# Patient Record
Sex: Female | Born: 1970 | Race: White | Hispanic: No | Marital: Married | Smoking: Never smoker
Health system: Southern US, Community
[De-identification: ages and names within clinical notes are randomized; demographics above are authoritative.]

## PROBLEM LIST (undated history)

## (undated) DIAGNOSIS — E039 Hypothyroidism, unspecified: Secondary | ICD-10-CM

## (undated) DIAGNOSIS — F419 Anxiety disorder, unspecified: Secondary | ICD-10-CM

## (undated) DIAGNOSIS — T4145XA Adverse effect of unspecified anesthetic, initial encounter: Secondary | ICD-10-CM

## (undated) HISTORY — PX: TONSILLECTOMY: SUR1361

---

## 1999-04-01 ENCOUNTER — Other Ambulatory Visit: Admission: RE | Admit: 1999-04-01 | Discharge: 1999-04-01 | Payer: Self-pay | Admitting: *Deleted

## 1999-04-27 ENCOUNTER — Other Ambulatory Visit: Admission: RE | Admit: 1999-04-27 | Discharge: 1999-04-27 | Payer: Self-pay | Admitting: Obstetrics & Gynecology

## 1999-04-27 ENCOUNTER — Encounter (INDEPENDENT_AMBULATORY_CARE_PROVIDER_SITE_OTHER): Payer: Self-pay

## 1999-06-14 ENCOUNTER — Other Ambulatory Visit: Admission: RE | Admit: 1999-06-14 | Discharge: 1999-06-14 | Payer: Self-pay | Admitting: Obstetrics & Gynecology

## 1999-09-02 ENCOUNTER — Other Ambulatory Visit: Admission: RE | Admit: 1999-09-02 | Discharge: 1999-09-02 | Payer: Self-pay | Admitting: Obstetrics & Gynecology

## 1999-12-15 ENCOUNTER — Other Ambulatory Visit: Admission: RE | Admit: 1999-12-15 | Discharge: 1999-12-15 | Payer: Self-pay | Admitting: *Deleted

## 2000-04-10 ENCOUNTER — Other Ambulatory Visit: Admission: RE | Admit: 2000-04-10 | Discharge: 2000-04-10 | Payer: Self-pay | Admitting: *Deleted

## 2000-10-19 ENCOUNTER — Other Ambulatory Visit: Admission: RE | Admit: 2000-10-19 | Discharge: 2000-10-19 | Payer: Self-pay | Admitting: Obstetrics & Gynecology

## 2001-04-15 ENCOUNTER — Other Ambulatory Visit: Admission: RE | Admit: 2001-04-15 | Discharge: 2001-04-15 | Payer: Self-pay | Admitting: Obstetrics and Gynecology

## 2002-05-01 ENCOUNTER — Other Ambulatory Visit: Admission: RE | Admit: 2002-05-01 | Discharge: 2002-05-01 | Payer: Self-pay | Admitting: Obstetrics and Gynecology

## 2003-06-02 ENCOUNTER — Other Ambulatory Visit: Admission: RE | Admit: 2003-06-02 | Discharge: 2003-06-02 | Payer: Self-pay | Admitting: Obstetrics and Gynecology

## 2008-01-01 ENCOUNTER — Ambulatory Visit (HOSPITAL_COMMUNITY): Admission: RE | Admit: 2008-01-01 | Discharge: 2008-01-01 | Payer: Self-pay | Admitting: Obstetrics & Gynecology

## 2008-02-01 ENCOUNTER — Ambulatory Visit (HOSPITAL_COMMUNITY): Admission: RE | Admit: 2008-02-01 | Discharge: 2008-02-01 | Payer: Self-pay | Admitting: Obstetrics & Gynecology

## 2008-02-02 ENCOUNTER — Inpatient Hospital Stay (HOSPITAL_COMMUNITY): Admission: AD | Admit: 2008-02-02 | Discharge: 2008-02-02 | Payer: Self-pay | Admitting: *Deleted

## 2008-09-04 DIAGNOSIS — T8859XA Other complications of anesthesia, initial encounter: Secondary | ICD-10-CM

## 2008-09-04 HISTORY — DX: Other complications of anesthesia, initial encounter: T88.59XA

## 2009-02-08 ENCOUNTER — Inpatient Hospital Stay (HOSPITAL_COMMUNITY): Admission: RE | Admit: 2009-02-08 | Discharge: 2009-02-11 | Payer: Self-pay | Admitting: Obstetrics and Gynecology

## 2009-03-24 ENCOUNTER — Ambulatory Visit: Admission: RE | Admit: 2009-03-24 | Discharge: 2009-03-24 | Payer: Self-pay | Admitting: Obstetrics and Gynecology

## 2009-05-28 ENCOUNTER — Ambulatory Visit: Payer: Self-pay | Admitting: Family Medicine

## 2009-05-28 DIAGNOSIS — E039 Hypothyroidism, unspecified: Secondary | ICD-10-CM | POA: Insufficient documentation

## 2009-05-28 DIAGNOSIS — E559 Vitamin D deficiency, unspecified: Secondary | ICD-10-CM | POA: Insufficient documentation

## 2009-05-28 DIAGNOSIS — F411 Generalized anxiety disorder: Secondary | ICD-10-CM | POA: Insufficient documentation

## 2009-05-31 ENCOUNTER — Telehealth (INDEPENDENT_AMBULATORY_CARE_PROVIDER_SITE_OTHER): Payer: Self-pay | Admitting: *Deleted

## 2009-06-01 LAB — CONVERTED CEMR LAB
ALT: 32 units/L (ref 0–35)
AST: 26 units/L (ref 0–37)
Albumin: 4.6 g/dL (ref 3.5–5.2)
BUN: 13 mg/dL (ref 6–23)
Basophils Relative: 1.6 % (ref 0.0–3.0)
Chloride: 107 meq/L (ref 96–112)
Cholesterol: 169 mg/dL (ref 0–200)
Eosinophils Relative: 2.6 % (ref 0.0–5.0)
Free T4: 1.2 ng/dL (ref 0.6–1.6)
HCT: 41 % (ref 36.0–46.0)
Hemoglobin: 13.7 g/dL (ref 12.0–15.0)
LDL Cholesterol: 82 mg/dL (ref 0–99)
Lymphs Abs: 2.3 10*3/uL (ref 0.7–4.0)
MCV: 89.5 fL (ref 78.0–100.0)
Monocytes Absolute: 0.4 10*3/uL (ref 0.1–1.0)
Monocytes Relative: 7.4 % (ref 3.0–12.0)
Neutro Abs: 2.4 10*3/uL (ref 1.4–7.7)
Platelets: 239 10*3/uL (ref 150.0–400.0)
Potassium: 4.1 meq/L (ref 3.5–5.1)
Sodium: 141 meq/L (ref 135–145)
TSH: 0.07 microintl units/mL — ABNORMAL LOW (ref 0.35–5.50)
Total Bilirubin: 0.7 mg/dL (ref 0.3–1.2)
Total Protein: 7.7 g/dL (ref 6.0–8.3)
Vit D, 25-Hydroxy: 31 ng/mL (ref 30–89)
WBC: 5.3 10*3/uL (ref 4.5–10.5)

## 2009-06-16 ENCOUNTER — Encounter (INDEPENDENT_AMBULATORY_CARE_PROVIDER_SITE_OTHER): Payer: Self-pay | Admitting: *Deleted

## 2009-06-21 ENCOUNTER — Ambulatory Visit: Payer: Self-pay | Admitting: Family Medicine

## 2009-06-21 DIAGNOSIS — M62838 Other muscle spasm: Secondary | ICD-10-CM | POA: Insufficient documentation

## 2009-06-21 DIAGNOSIS — L659 Nonscarring hair loss, unspecified: Secondary | ICD-10-CM | POA: Insufficient documentation

## 2009-06-21 DIAGNOSIS — E079 Disorder of thyroid, unspecified: Secondary | ICD-10-CM | POA: Insufficient documentation

## 2009-06-23 ENCOUNTER — Encounter (INDEPENDENT_AMBULATORY_CARE_PROVIDER_SITE_OTHER): Payer: Self-pay | Admitting: *Deleted

## 2010-01-02 IMAGING — RF DG HYSTEROGRAM
4 series · 4 of 4 positions shown · IV contrast (omnipaque)
Comparison: none

CLINICAL DATA: HYSTEROSALPINGOGRAM
TECHNIQUE: Following cleansing of the cervix and vagina with
Betadine solution, a hysterosalpingogram was performed using a 5-
French hysterosalpingogram catheter and Omnipaque 300 contrast.
The patient tolerated the exam without difficulty.

[Series 1: run · 1 of 1 slices shown (1 of 4)]
[im 1/1]
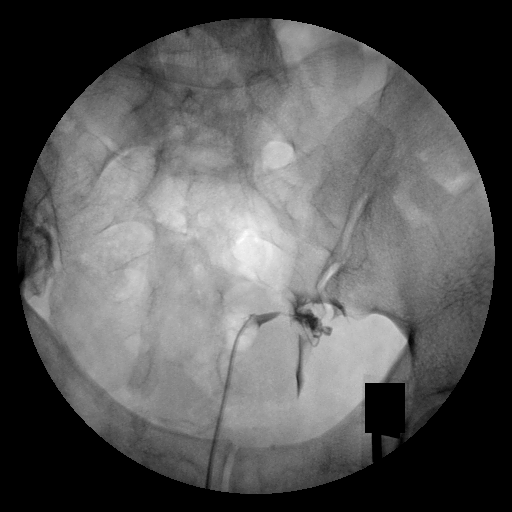

[Series 2: run · 1 of 1 slices shown (2 of 4)]
[im 1/1]
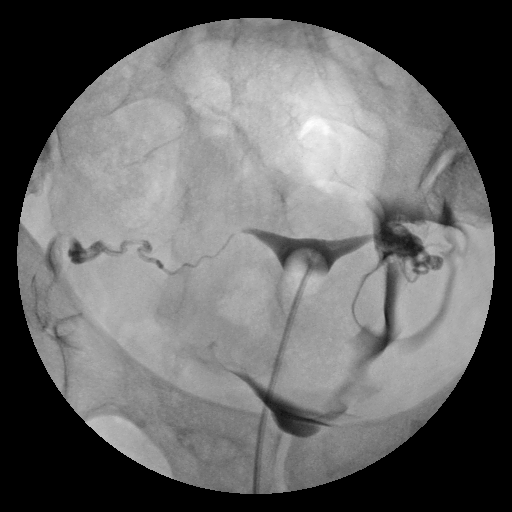

[Series 3: run · 1 of 1 slices shown (3 of 4)]
[im 1/1]
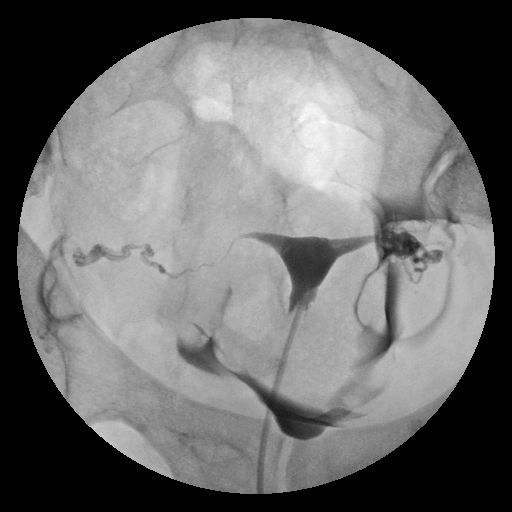

[Series 4: run · 1 of 1 slices shown (4 of 4)]
[im 1/1]
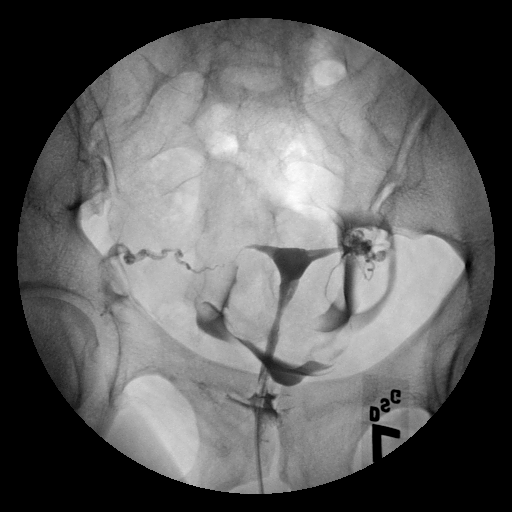

[4 of 4 positions shown; findings below may reference images not displayed]

FINDINGS: The endometrial cavity of the uterus is normal in contour
and appearance.

Contrast filling of both fallopian tubes is seen, and both tubes
are normal in appearance.  Intraperitoneal spill of contrast from
both fallopian tubes is demonstrated.
IMPRESSION: Normal study.  Fallopian tubes are patent bilaterally.

## 2010-12-12 LAB — CBC
HCT: 27.9 % — ABNORMAL LOW (ref 36.0–46.0)
HCT: 36.8 % (ref 36.0–46.0)
Hemoglobin: 13.2 g/dL (ref 12.0–15.0)
MCHC: 34.9 g/dL (ref 30.0–36.0)
MCHC: 35.7 g/dL (ref 30.0–36.0)
MCV: 91 fL (ref 78.0–100.0)
MCV: 93.3 fL (ref 78.0–100.0)
Platelets: 107 10*3/uL — ABNORMAL LOW (ref 150–400)
RBC: 4.05 MIL/uL (ref 3.87–5.11)
WBC: 10 10*3/uL (ref 4.0–10.5)
WBC: 7.5 10*3/uL (ref 4.0–10.5)

## 2011-01-17 NOTE — Op Note (Signed)
[name], [name]         ACCOUNT NO.:  [id number]   MEDICAL RECORD NO.:  [id number]          PATIENT TYPE:  INP   LOCATION:  9102                          FACILITY:  [facility]   PHYSICIAN:  Zenaida Niece, M.D.DATE OF BIRTH:  1971-01-23   DATE OF PROCEDURE:  02/08/2009  DATE OF DISCHARGE:                               OPERATIVE REPORT   PREOPERATIVE DIAGNOSIS:  Intrauterine pregnancy at 39 weeks breech  presentation.   POSTOPERATIVE DIAGNOSIS:  Intrauterine pregnancy at 39 weeks breech  presentation.   PROCEDURE:  Primary low transverse cesarean section without extensions.   SURGEON:  Zenaida Niece, MD   ASSISTANT:  Huel Cote, MD   ANESTHESIA:  Spinal.   FINDINGS:  The patient had normal gravid anatomy and delivered a viable  female infant with Apgars of 9 and 9, weight 8 pounds 6 ounces.  The baby  was in a compound breech presentation and had a nuchal cord x2.   SPECIMENS:  Placenta sent for cord blood donation and then to Labor and  Delivery.   ESTIMATED BLOOD LOSS:  800 mL.   COMPLICATIONS:  None.   PROCEDURE IN DETAIL:  The patient was taken to the operating room and  placed in the sitting position.  Dr. Pamalee Leyden instilled spinal anesthesia.  She was then placed in the dorsal supine position with a left lateral  tilt.  Abdomen was then prepped and draped in the usual sterile fashion  and a Foley catheter was inserted.  The level of her anesthesia was  found to be adequate and abdomen was entered via a standard Pfannenstiel  incision without complications.  The Alexis disposable self-retaining  retractor was placed and the lower uterine segment was well exposed.  A  4-cm transverse incision was then made in the lower uterine segment  pushing the bladder inferior.  Clear amniotic fluid was noted upon  entering the amniotic cavity.  The incision was then extended digitally.  There was a foot and leg at the incision.  The breech was able to be  brought to  the incision and delivered.  The right leg was then delivered  followed by the left which was a little bit more difficult.  However,  the baby then delivered to the shoulders, the arms delivered without  difficulty, and the head then also delivered without difficulty.  Nuchal  cord x2 was reduced.  Cord was doubly clamped and cut and the infant  handed to the awaiting pediatric team.  Placenta was then removed  manually as the uterus was contracting down quickly.  Placenta was sent  for cord blood donation.  Uterus was wiped dry with a clean lap pad and  all clots and debris removed.  Uterine incision was inspected and found  to be free of extensions.  Uterine incision was closed in 2 layers with  the first layer being a running locking layer with #1 chromic and the  second layer being an imbricating layer also with #1 chromic.  Bleeding  from the right middle of the incision was controlled with two figure-of-  eight sutures of #1 chromic.  This achieved adequate  hemostasis.  Tubes  and ovaries were inspected and found to be normal.  Bleeding from  serosal edges was controlled with electrocautery.  Uterine incision was  again inspected and found to be hemostatic.  The Alexis retractor was  removed.  The subfascial space was then irrigated and made hemostatic  with electrocautery.  Fascia was closed in running fashion starting at  both ends and meeting in the middle with 0 Vicryl.  Subcutaneous tissue  was irrigated and made hemostatic with electrocautery.  Subcutaneous  tissue was closed with running 2-0 plain gut suture.  Skin was then  closed with a running subcuticular suture of 4-0 Prolene as well as  Steri-Strips.  A sterile dressing was then applied.  The patient  tolerated the procedure well and was taken to the recovery room in  stable condition.  Counts were correct x2, and she received Ancef 1 g IV  at the beginning of the procedure and had PAS hose on throughout the   procedure.      Zenaida Niece, M.D.  Electronically Signed     TDM/MEDQ  D:  02/08/2009  T:  02/09/2009  Job:  [id number]

## 2011-01-17 NOTE — H&P (Signed)
[name], [name]         ACCOUNT NO.:  [id number]   MEDICAL RECORD NO.:  [id number]          PATIENT TYPE:  INP   LOCATION:  NA                            [facility]ACILITY:  [facility]   PHYSICIAN:  Zenaida Niece, M.D.DATE OF BIRTH:  1971/03/06   DATE OF ADMISSION:  DATE OF DISCHARGE:                              HISTORY & PHYSICAL   CHIEF COMPLAINT:  Cesarean section for breech.   PRESENT ILLNESS:  This is a 40 year old female, gravida 2, para 0-0-1-0,  with an EGA of 39+ weeks by an LMP consistent with a 7-week ultrasound  with a due date of June 12 who presents for primary cesarean section due  to breech presentation.  Options including external cephalic version  were discussed with the patient and she declines this and thus is  admitted for cesarean section.  Prenatal care has been essentially  uncomplicated.   PRENATAL LABS:  RPR is nonreactive, hepatitis B surface antigen  negative, rubella immune, HIV negative, blood type is O positive with  negative antibody screen.  Gonorrhea and chlamydia negative.  First  trimester screen normal.  MSAFP is normal.  One-hour Glucola 107.  Group  B strep is negative.   PAST OB HISTORY:  One spontaneous abortion.   GYN HISTORY:  The patient conceived with use of intrauterine  insemination and gonadotropin.  She also has a history of a LEEP in 2001  with normal follow-up Pap smears.   PAST MEDICAL HISTORY:  1. Hypothyroidism, well-controlled during pregnancy.  2. She has a history of squamous cell carcinoma of the skin   PAST SURGICAL HISTORY:  Tonsillectomy and adenoidectomy.   ALLERGIES:  AUGMENTIN causes an upset stomach.   MEDICATIONS:  Synthroid 50 mcg daily.   SOCIAL HISTORY:  She is married and denies alcohol, tobacco or drug use.   FAMILY HISTORY:  Noncontributory.   PHYSICAL EXAMINATION:  GENERAL:  This is a well-developed gravid female  in no acute distress.  Weight is 194 pounds, blood pressure was 118/82.  NECK:   Supple without lymphadenopathy or thyromegaly.  LUNGS: Clear to auscultation.  HEART: Regular rate and rhythm without murmur.  ABDOMEN:  Gravid, nontender with fundal height of 39-1/2 cm and breech  presentation by Leopold's.  EXTREMITIES: Have 1+ edema and are nontender.  Cervix is 3, 50, minus 3, and breech presentation.   ASSESSMENT:  1. Intrauterine pregnancy at 39 weeks with persistent breech      presentation.  Patient declines external cephalic version and is      being admitted for primary cesarean section.  All risks of cesarean      section have been discussed and she understands.  2. Hypothyroidism.   PLAN:  Admit the patient on February 08, 2009, for a primary cesarean section  for breech presentation.      Zenaida Niece, M.D.  Electronically Signed     TDM/MEDQ  D:  02/08/2009  T:  02/08/2009  Job:  [id number]

## 2011-01-17 NOTE — Discharge Summary (Signed)
[name], [name]         ACCOUNT NO.:  [id number]   MEDICAL RECORD NO.:  [id number]          PATIENT TYPE:  INP   LOCATION:  9102                          [facility]   PHYSICIAN:  Zenaida Niece, M.D.DATE OF BIRTH:  19-Aug-1971   DATE OF ADMISSION:  02/08/2009  DATE OF DISCHARGE:  02/11/2009                               DISCHARGE SUMMARY   CHIEF COMPLAINT:  Intrauterine pregnancy at 39 weeks with a breech  presentation.   PROCEDURES:  On June 7, she underwent primary low-transverse cesarean  section.   HISTORY AND PHYSICAL:  Briefly, this is a 40 year old female, gravida 2,  para 0-0-1-0 with an EGA of 39+ weeks, who presents for primary cesarean  section due to persistent breech presentation.  Her full history and  physical is dictated.  She conceived with use of intrauterine  insemination and gonadotrophins.  She has a history of a LEEP in 2001  and hypothyroidism.  She has been on Synthroid throughout the pregnancy.  Physical exam significant for gravid abdomen with a fundal height of 39-  1/2 cm.  Cervix is 3, 50, -3 in breech presentation.   HOSPITAL COURSE:  The patient is admitted on the day of surgery and  underwent primary cesarean section under spinal anesthesia.  She had  normal anatomy and delivered a viable female infant with Apgars of 9 and  9, weighed 8 pounds 6 ounces.  There was a compound breech presentation  with nuchal cord x2.  Estimated blood loss was 800 mL.  Postoperatively,  she had no significant complications.  Preoperative hemoglobin was 13.2,  postoperative was 9.8.  Preoperative platelets were 144,000,  postoperative 107,000.  She was rapidly able to ambulate and tolerate a  diet.  On postoperative #3, she was felt to be stable for discharge  home.  At that time, her subcuticular Prolene suture was removed and  Steri-Strips were left in place.   DISCHARGE INSTRUCTIONS:  Regular diet, pelvic rest, no strenuous  activity.  Followup is  in 2 weeks for an incision check.   MEDICATIONS:  Percocet #30 one to two p.o. q. 4-6 hours p.r.n. pain and  over-the-counter ibuprofen as needed, and she is given our discharge  pamphlet.      Zenaida Niece, M.D.  Electronically Signed     TDM/MEDQ  D:  03/19/2009  T:  03/20/2009  Job:  [id number]

## 2011-02-19 ENCOUNTER — Inpatient Hospital Stay (HOSPITAL_COMMUNITY)
Admission: AD | Admit: 2011-02-19 | Discharge: 2011-02-19 | Disposition: A | Discharge status: Home / Self Care | Payer: BC Managed Care – PPO | Source: Ambulatory Visit | Attending: Obstetrics and Gynecology | Admitting: Obstetrics and Gynecology

## 2011-02-19 DIAGNOSIS — O209 Hemorrhage in early pregnancy, unspecified: Secondary | ICD-10-CM | POA: Insufficient documentation

## 2011-03-06 LAB — HIV ANTIBODY (ROUTINE TESTING W REFLEX): HIV: NONREACTIVE

## 2011-03-06 LAB — ABO/RH: RH Type: POSITIVE

## 2011-03-06 LAB — RPR: RPR: NONREACTIVE

## 2011-03-15 ENCOUNTER — Other Ambulatory Visit (HOSPITAL_COMMUNITY): Payer: Self-pay | Admitting: Obstetrics and Gynecology

## 2011-03-15 DIAGNOSIS — O280 Abnormal hematological finding on antenatal screening of mother: Secondary | ICD-10-CM

## 2011-03-15 DIAGNOSIS — O09529 Supervision of elderly multigravida, unspecified trimester: Secondary | ICD-10-CM

## 2011-03-17 ENCOUNTER — Ambulatory Visit (HOSPITAL_COMMUNITY)
Admission: RE | Admit: 2011-03-17 | Discharge: 2011-03-17 | Disposition: A | Discharge status: Home / Self Care | Payer: BC Managed Care – PPO | Source: Ambulatory Visit | Attending: Obstetrics and Gynecology | Admitting: Obstetrics and Gynecology

## 2011-03-17 ENCOUNTER — Encounter (HOSPITAL_COMMUNITY): Payer: Self-pay

## 2011-03-17 DIAGNOSIS — E079 Disorder of thyroid, unspecified: Secondary | ICD-10-CM | POA: Insufficient documentation

## 2011-03-17 DIAGNOSIS — E039 Hypothyroidism, unspecified: Secondary | ICD-10-CM | POA: Insufficient documentation

## 2011-03-17 DIAGNOSIS — O280 Abnormal hematological finding on antenatal screening of mother: Secondary | ICD-10-CM

## 2011-03-17 DIAGNOSIS — O09529 Supervision of elderly multigravida, unspecified trimester: Secondary | ICD-10-CM

## 2011-03-17 DIAGNOSIS — O99280 Endocrine, nutritional and metabolic diseases complicating pregnancy, unspecified trimester: Secondary | ICD-10-CM | POA: Insufficient documentation

## 2011-03-17 NOTE — Progress Notes (Signed)
Report in AS/EPIC; bilateral choroid plexus cysts  Scheduled for fetal anatomic survey  3 weeks

## 2011-03-20 DIAGNOSIS — O351XX0 Maternal care for (suspected) chromosomal abnormality in fetus, not applicable or unspecified: Secondary | ICD-10-CM | POA: Insufficient documentation

## 2011-03-20 DIAGNOSIS — O09529 Supervision of elderly multigravida, unspecified trimester: Secondary | ICD-10-CM | POA: Insufficient documentation

## 2011-03-20 NOTE — Progress Notes (Signed)
Genetic Counseling  High-Risk Gestation Note  Appointment Date:  03/17/2011 Referred By: Jaclyn Niece, MD Date of Birth:  Jun 20, 1971   I met with [name] and her husband, [name] regarding a screen positive Down syndrome risk from first trimester screening. The patient is also advanced maternal age.   Both family histories were reviewed and found to be noncontributory  for birth defects, mental retardation, recurrent pregnancy loss, and known genetic conditions.  Without further information regarding the provided family history, an accurate genetic risk cannot be calculated.   Further genetic counseling is warranted if more information is obtained.  They were counseled regarding maternal age and the association with risk for chromosome conditions due to nondisjunction with aging of the ova.   We reviewed chromosomes, nondisjunction, and the associated 1 in 87 risk for fetal aneuploidy in the second trimester related to a maternal age of 9 at delivery.  They were counseled that the risk for aneuploidy decreases as gestational age increases, accounting for those pregnancies which spontaneously abort.  We specifically discussed Down syndrome (trisomy 21) and trisomy 72, including the common features and prognoses of each.   We reviewed the results of [name] first trimester screen. They understand that screening tests are used to modify a patient's a priori risk for aneuploidy, typically based on age.  This estimate provides a pregnancy specific risk assessment and is not diagnostic for these conditions. We reviewed that according to these screen results, the risk for Down syndrome in the pregnancy was reduced from the patient's age-related risk of 1 in 86 to 1 in 190. However, given that 1 in 190 is above the screen's cutoff, this is still considered a screen positive result. Additionally, this screen reduced the risk for Trisomy 18 from the patient's  age-related risk of 1 in 169 to 1 in 3600.    We reviewed the available screening option of ultrasound. They were counseled that 50-80% of fetuses with Down syndrome and up to 90% of fetuses with trisomies 13 and 18, when well visualized, have detectable anomalies or soft markers by second trimester ultrasound. We also reviewed the availability of the diagnostic option of amniocentesis. A risk of 1 in 200-300 was given for amniocentesis, the primary complication being spontaneous pregnancy loss. We discussed the risks, limitations, and benefits of each. It was discussed that not all birth defects can be identified with these procedures. Ultrasound performed today confirmed the pregnancy to be 14 weeks and 0 days gestation. Bilateral choroid plexus cysts were visualized at this time. We discussed that the presence of choroid plexus cysts slightly increases the risk for Trisomy 18 above her screen adjusted risk of 1 in 3600. Isolated choroid plexus cysts are typically not expected to increase the risk for Down syndrome. It was discussed that given the early gestation, a complete fetal anatomic survey could not be completed. A follow-up ultrasound was planned for approximately [redacted] weeks gestation.  After reviewing these options, [name] and her partner elected to proceed with amniocentesis, which was scheduled for 03/29/11.  They understand that ultrasound and First screen cannot rule out all birth defects or genetic syndromes.   She denied exposure to environmental toxins or chemical agents.  She denied the use of alcohol, tobacco or street drugs.  She denied significant viral illnesses during the course of her pregnancy.  Her medical and surgical history were contributory for two episodes of spotting, which the patient reported she is no longer having.  A complete obstetrical ultrasound was performed at the time of today's evaluation.  The ultrasound report is reported separately.    The patient was  provided written information which discussed cystic fibrosis (CF) including: the features of CF, the incidence of 1 in 3300 in the Caucasian population, autosomal recessive inheritance, and the 25% chance of having a baby with CF if both parents are carriers of CF.  Also discussed was the option of carrier testing including the pros and cons of carrier testing, as well as the option of prenatal testing if needed. [name] declined further discussion of CF carrier screening at this time.   We counseled the patient for approximately 40 minutes regarding the above risks.     Clydie Braun Shondrea Steinert, MS, [facility] 03/20/11

## 2011-03-29 ENCOUNTER — Other Ambulatory Visit: Payer: Self-pay | Admitting: Obstetrics and Gynecology

## 2011-03-29 ENCOUNTER — Ambulatory Visit (HOSPITAL_COMMUNITY)
Admission: RE | Admit: 2011-03-29 | Discharge: 2011-03-29 | Disposition: A | Discharge status: Home / Self Care | Payer: BC Managed Care – PPO | Source: Ambulatory Visit | Attending: Obstetrics and Gynecology | Admitting: Obstetrics and Gynecology

## 2011-03-29 ENCOUNTER — Encounter (HOSPITAL_COMMUNITY): Payer: Self-pay

## 2011-03-29 DIAGNOSIS — O09529 Supervision of elderly multigravida, unspecified trimester: Secondary | ICD-10-CM | POA: Insufficient documentation

## 2011-03-29 DIAGNOSIS — E039 Hypothyroidism, unspecified: Secondary | ICD-10-CM | POA: Insufficient documentation

## 2011-03-29 DIAGNOSIS — O280 Abnormal hematological finding on antenatal screening of mother: Secondary | ICD-10-CM

## 2011-03-29 DIAGNOSIS — E079 Disorder of thyroid, unspecified: Secondary | ICD-10-CM | POA: Insufficient documentation

## 2011-03-29 NOTE — Progress Notes (Addendum)
Report in AS/EPIC; amniocentesis for CP cysts No complications; karyotype and FISH studies pending. Scheduled for anatomic survey, 2 weeks.

## 2011-03-30 ENCOUNTER — Telehealth (HOSPITAL_COMMUNITY): Payer: Self-pay | Admitting: MS"

## 2011-03-30 NOTE — Telephone Encounter (Signed)
Spoke with patient's husband regarding FISH results of amniocentesis. Apparently normal female results by [facility] for aneuploidy. Final karyotype pending. Will contact patient once final results available.

## 2011-04-07 ENCOUNTER — Telehealth (HOSPITAL_COMMUNITY): Payer: Self-pay | Admitting: MS"

## 2011-04-07 NOTE — Telephone Encounter (Signed)
Final chromosome analysis from amniocentesis revealed apparently normal female karyotype (46,XY). Results and gender disclosed to patient.

## 2011-04-11 ENCOUNTER — Ambulatory Visit (HOSPITAL_COMMUNITY): Payer: BC Managed Care – PPO | Attending: Obstetrics and Gynecology

## 2011-08-23 ENCOUNTER — Encounter (HOSPITAL_COMMUNITY): Payer: Self-pay | Admitting: Pharmacist

## 2011-08-31 ENCOUNTER — Inpatient Hospital Stay (HOSPITAL_COMMUNITY): Admission: RE | Admit: 2011-08-31 | Payer: BC Managed Care – PPO | Source: Ambulatory Visit

## 2011-09-01 ENCOUNTER — Encounter (HOSPITAL_COMMUNITY)
Admission: RE | Admit: 2011-09-01 | Discharge: 2011-09-01 | Disposition: A | Discharge status: Home / Self Care | Payer: BC Managed Care – PPO | Source: Ambulatory Visit | Attending: Obstetrics and Gynecology | Admitting: Obstetrics and Gynecology

## 2011-09-01 ENCOUNTER — Encounter (HOSPITAL_COMMUNITY): Payer: Self-pay

## 2011-09-01 HISTORY — DX: Hypothyroidism, unspecified: E03.9

## 2011-09-01 HISTORY — DX: Adverse effect of unspecified anesthetic, initial encounter: T41.45XA

## 2011-09-01 HISTORY — DX: Anxiety disorder, unspecified: F41.9

## 2011-09-01 LAB — CBC
HCT: 36 % (ref 36.0–46.0)
MCHC: 34.7 g/dL (ref 30.0–36.0)
MCV: 89.3 fL (ref 78.0–100.0)
RDW: 14.1 % (ref 11.5–15.5)
WBC: 6.8 10*3/uL (ref 4.0–10.5)

## 2011-09-01 NOTE — Patient Instructions (Signed)
YOUR PROCEDURE IS SCHEDULED ON: 09/06/11  ENTER THROUGH THE MAIN ENTRANCE OF [facility] AT:11am  USE DESK PHONE AND DIAL [id number] TO INFORM us OF YOUR ARRIVAL  CALL [phone] IF YOU HAVE ANY QUESTIONS OR PROBLEMS PRIOR TO YOUR ARRIVAL.  REMEMBER: DO NOT EAT AFTER MIDNIGHT :Tuesday  SPECIAL INSTRUCTIONS:clear liquids until 0830 am on Wed  YOU MAY BRUSH YOUR TEETH THE MORNING OF SURGERY   TAKE THESE MEDICINES THE DAY OF SURGERY WITH SIP OF WATER:   DO NOT WEAR JEWELRY, EYE MAKEUP, LIPSTICK OR DARK FINGERNAIL POLISH DO NOT WEAR LOTIONS  DO NOT SHAVE FOR 48 HOURS PRIOR TO SURGERY  YOU WILL NOT BE ALLOWED TO DRIVE YOURSELF HOME.  NAME OF DRIVER:Chris- [phone]

## 2011-09-05 MED ORDER — CEFAZOLIN SODIUM-DEXTROSE 2-3 GM-% IV SOLR
2.0000 g | INTRAVENOUS | Status: AC
  Administered 2011-09-06: 2 g via INTRAVENOUS
  Filled 2011-09-05: qty 50

## 2011-09-05 MED ORDER — CEFAZOLIN SODIUM-DEXTROSE 2-3 GM-% IV SOLR
2.0000 g | INTRAVENOUS | Status: DC
  Filled 2011-09-05: qty 50

## 2011-09-06 ENCOUNTER — Encounter (HOSPITAL_COMMUNITY): Payer: Self-pay | Admitting: *Deleted

## 2011-09-06 ENCOUNTER — Inpatient Hospital Stay (HOSPITAL_COMMUNITY)
Admission: RE | Admit: 2011-09-06 | Discharge: 2011-09-09 | DRG: 371 | Disposition: A | Discharge status: Home / Self Care | Payer: BC Managed Care – PPO | Source: Ambulatory Visit | Attending: Obstetrics and Gynecology | Admitting: Obstetrics and Gynecology

## 2011-09-06 ENCOUNTER — Encounter (HOSPITAL_COMMUNITY)
Admission: RE | Disposition: A | Discharge status: Home / Self Care | Payer: Self-pay | Source: Ambulatory Visit | Attending: Obstetrics and Gynecology

## 2011-09-06 ENCOUNTER — Encounter (HOSPITAL_COMMUNITY): Payer: Self-pay | Admitting: Anesthesiology

## 2011-09-06 ENCOUNTER — Inpatient Hospital Stay (HOSPITAL_COMMUNITY): Payer: BC Managed Care – PPO | Admitting: Anesthesiology

## 2011-09-06 DIAGNOSIS — Z98891 History of uterine scar from previous surgery: Secondary | ICD-10-CM

## 2011-09-06 DIAGNOSIS — Z01818 Encounter for other preprocedural examination: Secondary | ICD-10-CM

## 2011-09-06 DIAGNOSIS — E079 Disorder of thyroid, unspecified: Secondary | ICD-10-CM | POA: Diagnosis present

## 2011-09-06 DIAGNOSIS — E039 Hypothyroidism, unspecified: Secondary | ICD-10-CM | POA: Diagnosis present

## 2011-09-06 DIAGNOSIS — O99284 Endocrine, nutritional and metabolic diseases complicating childbirth: Secondary | ICD-10-CM | POA: Diagnosis present

## 2011-09-06 DIAGNOSIS — Z22330 Carrier of Group B streptococcus: Secondary | ICD-10-CM

## 2011-09-06 DIAGNOSIS — O99892 Other specified diseases and conditions complicating childbirth: Secondary | ICD-10-CM | POA: Diagnosis present

## 2011-09-06 DIAGNOSIS — Z01812 Encounter for preprocedural laboratory examination: Secondary | ICD-10-CM

## 2011-09-06 DIAGNOSIS — O34219 Maternal care for unspecified type scar from previous cesarean delivery: Principal | ICD-10-CM | POA: Diagnosis present

## 2011-09-06 LAB — HIV ANTIBODY (ROUTINE TESTING W REFLEX): HIV: NONREACTIVE

## 2011-09-06 SURGERY — Surgical Case
Anesthesia: Spinal | Site: Abdomen | Wound class: Clean Contaminated

## 2011-09-06 MED ORDER — LANOLIN HYDROUS EX OINT: 1.0000 "application " | TOPICAL_OINTMENT | CUTANEOUS | Status: DC | PRN

## 2011-09-06 MED ORDER — MORPHINE SULFATE (PF) 0.5 MG/ML IJ SOLN
INTRAMUSCULAR | Status: DC | PRN
  Administered 2011-09-06: .1 mg via INTRATHECAL

## 2011-09-06 MED ORDER — SCOPOLAMINE 1 MG/3DAYS TD PT72
1.0000 | MEDICATED_PATCH | Freq: Once | TRANSDERMAL | Status: DC
  Administered 2011-09-06: 1.5 mg via TRANSDERMAL

## 2011-09-06 MED ORDER — FENTANYL CITRATE 0.05 MG/ML IJ SOLN
INTRAMUSCULAR | Status: AC
  Filled 2011-09-06: qty 2

## 2011-09-06 MED ORDER — MENTHOL 3 MG MT LOZG: 1.0000 | LOZENGE | OROMUCOSAL | Status: DC | PRN

## 2011-09-06 MED ORDER — LACTATED RINGERS IV SOLN
INTRAVENOUS | Status: DC
  Administered 2011-09-06: 23:00:00 via INTRAVENOUS

## 2011-09-06 MED ORDER — OXYTOCIN 20 UNITS IN LACTATED RINGERS INFUSION - SIMPLE
125.0000 mL/h | INTRAVENOUS | Status: AC
  Administered 2011-09-06: 125 mL/h via INTRAVENOUS

## 2011-09-06 MED ORDER — BUPIVACAINE IN DEXTROSE 0.75-8.25 % IT SOLN
INTRATHECAL | Status: DC | PRN
  Administered 2011-09-06: 12 mg via INTRATHECAL

## 2011-09-06 MED ORDER — DIPHENHYDRAMINE HCL 25 MG PO TABS
25.0000 mg | ORAL_TABLET | Freq: Every evening | ORAL | Status: DC | PRN
  Filled 2011-09-06: qty 1

## 2011-09-06 MED ORDER — ONDANSETRON HCL 4 MG/2ML IJ SOLN
INTRAMUSCULAR | Status: DC | PRN
  Administered 2011-09-06: 4 mg via INTRAVENOUS

## 2011-09-06 MED ORDER — NALBUPHINE SYRINGE 5 MG/0.5 ML
5.0000 mg | INJECTION | INTRAMUSCULAR | Status: DC | PRN
  Filled 2011-09-06: qty 1

## 2011-09-06 MED ORDER — MEPERIDINE HCL 25 MG/ML IJ SOLN: 6.2500 mg | INTRAMUSCULAR | Status: DC | PRN

## 2011-09-06 MED ORDER — KETOROLAC TROMETHAMINE 30 MG/ML IJ SOLN
30.0000 mg | Freq: Four times a day (QID) | INTRAMUSCULAR | Status: AC | PRN
  Administered 2011-09-06 (×2): 30 mg via INTRAMUSCULAR

## 2011-09-06 MED ORDER — DIPHENHYDRAMINE HCL 50 MG/ML IJ SOLN: 12.5000 mg | INTRAMUSCULAR | Status: DC | PRN

## 2011-09-06 MED ORDER — METOCLOPRAMIDE HCL 5 MG/ML IJ SOLN
10.0000 mg | Freq: Three times a day (TID) | INTRAMUSCULAR | Status: DC | PRN
  Administered 2011-09-06: 10 mg via INTRAVENOUS

## 2011-09-06 MED ORDER — ZOLPIDEM TARTRATE 5 MG PO TABS: 5.0000 mg | ORAL_TABLET | Freq: Every evening | ORAL | Status: DC | PRN

## 2011-09-06 MED ORDER — ONDANSETRON HCL 4 MG/2ML IJ SOLN
INTRAMUSCULAR | Status: AC
  Filled 2011-09-06: qty 2

## 2011-09-06 MED ORDER — TETANUS-DIPHTH-ACELL PERTUSSIS 5-2.5-18.5 LF-MCG/0.5 IM SUSP
0.5000 mL | Freq: Once | INTRAMUSCULAR | Status: AC
  Administered 2011-09-08: 0.5 mL via INTRAMUSCULAR
  Filled 2011-09-06 (×2): qty 0.5

## 2011-09-06 MED ORDER — IBUPROFEN 600 MG PO TABS
600.0000 mg | ORAL_TABLET | Freq: Four times a day (QID) | ORAL | Status: DC | PRN
  Administered 2011-09-07: 600 mg via ORAL
  Filled 2011-09-06 (×4): qty 1

## 2011-09-06 MED ORDER — ACETAMINOPHEN 10 MG/ML IV SOLN
1000.0000 mg | Freq: Four times a day (QID) | INTRAVENOUS | Status: AC | PRN
  Filled 2011-09-06: qty 100

## 2011-09-06 MED ORDER — ONDANSETRON HCL 4 MG/2ML IJ SOLN: 4.0000 mg | INTRAMUSCULAR | Status: DC | PRN

## 2011-09-06 MED ORDER — PRENATAL MULTIVITAMIN CH
1.0000 | ORAL_TABLET | Freq: Every day | ORAL | Status: DC
  Administered 2011-09-07 – 2011-09-08 (×2): 1 via ORAL
  Filled 2011-09-06 (×3): qty 1

## 2011-09-06 MED ORDER — DIBUCAINE 1 % RE OINT: 1.0000 "application " | TOPICAL_OINTMENT | RECTAL | Status: DC | PRN

## 2011-09-06 MED ORDER — ACETAMINOPHEN 325 MG PO TABS: 325.0000 mg | ORAL_TABLET | ORAL | Status: DC | PRN

## 2011-09-06 MED ORDER — OXYTOCIN 10 UNIT/ML IJ SOLN
INTRAMUSCULAR | Status: AC
  Filled 2011-09-06: qty 2

## 2011-09-06 MED ORDER — 0.9 % SODIUM CHLORIDE (POUR BTL) OPTIME
TOPICAL | Status: DC | PRN
  Administered 2011-09-06: 1000 mL

## 2011-09-06 MED ORDER — IBUPROFEN 600 MG PO TABS
600.0000 mg | ORAL_TABLET | Freq: Four times a day (QID) | ORAL | Status: DC
  Administered 2011-09-07 – 2011-09-09 (×9): 600 mg via ORAL
  Filled 2011-09-06 (×5): qty 1

## 2011-09-06 MED ORDER — SODIUM CHLORIDE 0.9 % IJ SOLN: 3.0000 mL | INTRAMUSCULAR | Status: DC | PRN

## 2011-09-06 MED ORDER — KETOROLAC TROMETHAMINE 30 MG/ML IJ SOLN
30.0000 mg | Freq: Four times a day (QID) | INTRAMUSCULAR | Status: AC | PRN
  Filled 2011-09-06: qty 1

## 2011-09-06 MED ORDER — DIPHENHYDRAMINE HCL 25 MG PO CAPS: 25.0000 mg | ORAL_CAPSULE | ORAL | Status: DC | PRN

## 2011-09-06 MED ORDER — SIMETHICONE 80 MG PO CHEW
80.0000 mg | CHEWABLE_TABLET | Freq: Three times a day (TID) | ORAL | Status: DC
  Administered 2011-09-06 – 2011-09-09 (×10): 80 mg via ORAL

## 2011-09-06 MED ORDER — OXYTOCIN 20 UNITS IN LACTATED RINGERS INFUSION - SIMPLE
INTRAVENOUS | Status: AC
  Filled 2011-09-06: qty 1000

## 2011-09-06 MED ORDER — FENTANYL CITRATE 0.05 MG/ML IJ SOLN: 25.0000 ug | INTRAMUSCULAR | Status: DC | PRN

## 2011-09-06 MED ORDER — FENTANYL CITRATE 0.05 MG/ML IJ SOLN
INTRAMUSCULAR | Status: DC | PRN
  Administered 2011-09-06: 25 ug via INTRATHECAL

## 2011-09-06 MED ORDER — DIPHENHYDRAMINE HCL 50 MG/ML IJ SOLN: 25.0000 mg | INTRAMUSCULAR | Status: DC | PRN

## 2011-09-06 MED ORDER — SENNOSIDES-DOCUSATE SODIUM 8.6-50 MG PO TABS
2.0000 | ORAL_TABLET | Freq: Every day | ORAL | Status: DC
  Administered 2011-09-06 – 2011-09-08 (×3): 2 via ORAL

## 2011-09-06 MED ORDER — WITCH HAZEL-GLYCERIN EX PADS: 1.0000 "application " | MEDICATED_PAD | CUTANEOUS | Status: DC | PRN

## 2011-09-06 MED ORDER — LIDOCAINE HCL (CARDIAC) 20 MG/ML IV SOLN
INTRAVENOUS | Status: AC
  Filled 2011-09-06: qty 5

## 2011-09-06 MED ORDER — OXYCODONE-ACETAMINOPHEN 5-325 MG PO TABS
1.0000 | ORAL_TABLET | ORAL | Status: DC | PRN
  Administered 2011-09-06 – 2011-09-08 (×14): 1 via ORAL
  Administered 2011-09-09 (×2): 2 via ORAL
  Filled 2011-09-06: qty 1
  Filled 2011-09-06: qty 2
  Filled 2011-09-06 (×5): qty 1
  Filled 2011-09-06: qty 2
  Filled 2011-09-06 (×7): qty 1

## 2011-09-06 MED ORDER — LACTATED RINGERS IV SOLN
INTRAVENOUS | Status: DC
  Administered 2011-09-06 (×4): via INTRAVENOUS

## 2011-09-06 MED ORDER — SCOPOLAMINE 1 MG/3DAYS TD PT72
MEDICATED_PATCH | TRANSDERMAL | Status: AC
  Administered 2011-09-06: 1.5 mg via TRANSDERMAL
  Filled 2011-09-06: qty 1

## 2011-09-06 MED ORDER — OXYTOCIN 20 UNITS IN LACTATED RINGERS INFUSION - SIMPLE
INTRAVENOUS | Status: DC | PRN
  Administered 2011-09-06: 20 [IU] via INTRAVENOUS

## 2011-09-06 MED ORDER — MAGNESIUM HYDROXIDE 400 MG/5ML PO SUSP: 30.0000 mL | ORAL | Status: DC | PRN

## 2011-09-06 MED ORDER — PROMETHAZINE HCL 25 MG/ML IJ SOLN: 6.2500 mg | INTRAMUSCULAR | Status: DC | PRN

## 2011-09-06 MED ORDER — NALOXONE HCL 0.4 MG/ML IJ SOLN: 0.4000 mg | INTRAMUSCULAR | Status: DC | PRN

## 2011-09-06 MED ORDER — ONDANSETRON HCL 4 MG/2ML IJ SOLN: 4.0000 mg | Freq: Three times a day (TID) | INTRAMUSCULAR | Status: DC | PRN

## 2011-09-06 MED ORDER — LIDOCAINE IN DEXTROSE 5-7.5 % IV SOLN
INTRAVENOUS | Status: DC | PRN
  Administered 2011-09-06: 20 mg via INTRATHECAL

## 2011-09-06 MED ORDER — MORPHINE SULFATE 0.5 MG/ML IJ SOLN
INTRAMUSCULAR | Status: AC
  Filled 2011-09-06: qty 10

## 2011-09-06 MED ORDER — ONDANSETRON HCL 4 MG PO TABS: 4.0000 mg | ORAL_TABLET | ORAL | Status: DC | PRN

## 2011-09-06 MED ORDER — SODIUM CHLORIDE 0.9 % IV SOLN
1.0000 ug/kg/h | INTRAVENOUS | Status: DC | PRN
  Filled 2011-09-06: qty 2.5

## 2011-09-06 MED ORDER — NALBUPHINE SYRINGE 5 MG/0.5 ML
5.0000 mg | INJECTION | INTRAMUSCULAR | Status: DC | PRN
  Administered 2011-09-06 – 2011-09-07 (×2): 5 mg via INTRAVENOUS
  Filled 2011-09-06 (×2): qty 1

## 2011-09-06 MED ORDER — MEASLES, MUMPS & RUBELLA VAC ~~LOC~~ INJ
0.5000 mL | INJECTION | Freq: Once | SUBCUTANEOUS | Status: DC
  Filled 2011-09-06: qty 0.5

## 2011-09-06 MED ORDER — KETOROLAC TROMETHAMINE 30 MG/ML IJ SOLN
INTRAMUSCULAR | Status: AC
  Administered 2011-09-06: 30 mg via INTRAMUSCULAR
  Filled 2011-09-06: qty 1

## 2011-09-06 MED ORDER — PHENYLEPHRINE 40 MCG/ML (10ML) SYRINGE FOR IV PUSH (FOR BLOOD PRESSURE SUPPORT)
PREFILLED_SYRINGE | INTRAVENOUS | Status: AC
  Filled 2011-09-06: qty 10

## 2011-09-06 MED ORDER — PHENYLEPHRINE HCL 10 MG/ML IJ SOLN
INTRAMUSCULAR | Status: DC | PRN
  Administered 2011-09-06: 80 ug via INTRAVENOUS
  Administered 2011-09-06: 120 ug via INTRAVENOUS
  Administered 2011-09-06: 80 ug via INTRAVENOUS

## 2011-09-06 MED ORDER — METOCLOPRAMIDE HCL 5 MG/ML IJ SOLN
INTRAMUSCULAR | Status: AC
  Administered 2011-09-06: 10 mg via INTRAVENOUS
  Filled 2011-09-06: qty 2

## 2011-09-06 MED ORDER — DIPHENHYDRAMINE HCL 25 MG PO CAPS: 25.0000 mg | ORAL_CAPSULE | Freq: Four times a day (QID) | ORAL | Status: DC | PRN

## 2011-09-06 MED ORDER — SIMETHICONE 80 MG PO CHEW: 80.0000 mg | CHEWABLE_TABLET | ORAL | Status: DC | PRN

## 2011-09-06 MED ORDER — SCOPOLAMINE 1 MG/3DAYS TD PT72
1.0000 | MEDICATED_PATCH | Freq: Once | TRANSDERMAL | Status: DC
  Filled 2011-09-06: qty 1

## 2011-09-06 SURGICAL SUPPLY — 29 items
CHLORAPREP W/TINT 26ML (MISCELLANEOUS) ×2 IMPLANT
CLOTH BEACON ORANGE TIMEOUT ST (SAFETY) ×2 IMPLANT
CONTAINER PREFILL 10% NBF 15ML (MISCELLANEOUS) IMPLANT
DRSG COVADERM 4X8 (GAUZE/BANDAGES/DRESSINGS) ×1 IMPLANT
ELECT REM PT RETURN 9FT ADLT (ELECTROSURGICAL) ×2
ELECTRODE REM PT RTRN 9FT ADLT (ELECTROSURGICAL) ×1 IMPLANT
EXTRACTOR VACUUM KIWI (MISCELLANEOUS) IMPLANT
EXTRACTOR VACUUM M CUP 4 TUBE (SUCTIONS) IMPLANT
GLOVE BIO SURGEON STRL SZ8 (GLOVE) ×2 IMPLANT
GLOVE ORTHO TXT STRL SZ7.5 (GLOVE) ×2 IMPLANT
GOWN PREVENTION PLUS LG XLONG (DISPOSABLE) ×4 IMPLANT
KIT ABG SYR 3ML LUER SLIP (SYRINGE) IMPLANT
NDL HYPO 25X5/8 SAFETYGLIDE (NEEDLE) ×1 IMPLANT
NEEDLE HYPO 25X5/8 SAFETYGLIDE (NEEDLE) ×2 IMPLANT
NS IRRIG 1000ML POUR BTL (IV SOLUTION) ×2 IMPLANT
PACK C SECTION WH (CUSTOM PROCEDURE TRAY) ×2 IMPLANT
RTRCTR C-SECT PINK 25CM LRG (MISCELLANEOUS) ×2 IMPLANT
SLEEVE SCD COMPRESS KNEE MED (MISCELLANEOUS) IMPLANT
STAPLER VISISTAT 35W (STAPLE) IMPLANT
SUT CHROMIC 1 CTX 36 (SUTURE) ×4 IMPLANT
SUT PLAIN 0 NONE (SUTURE) IMPLANT
SUT PLAIN 2 0 XLH (SUTURE) IMPLANT
SUT PROLENE 4 0 KS NDL (SUTURE) IMPLANT
SUT PROLENE 4 0 KS NEEDLE (SUTURE) IMPLANT
SUT VIC AB 0 CT1 27 (SUTURE) ×8
SUT VIC AB 0 CT1 27XBRD ANBCTR (SUTURE) ×2 IMPLANT
TOWEL OR 17X24 6PK STRL BLUE (TOWEL DISPOSABLE) ×4 IMPLANT
TRAY FOLEY CATH 14FR (SET/KITS/TRAYS/PACK) ×2 IMPLANT
WATER STERILE IRR 1000ML POUR (IV SOLUTION) ×2 IMPLANT

## 2011-09-06 NOTE — H&P (Signed)
[name] is a 41 y.o. female, G2 P1001, EGA [redacted] weeks with EDC 1-9 presenting for repeat c-section.  Prenatal care complicated by 1:190 risk Trisomy 21 by first trimester screen, nl 46XY amnio.  Previous c-section, CTOL but declines VBAC.  Prenatal care otherwise uncomplicated, see prenatal records for complete history.    History OB History    Grav Para Term Preterm Abortions TAB SAB Ect Mult Living   2 1 1  0 0 0 0 0 0 1     Past Medical History  Diagnosis Date  . Complication of anesthesia 2010    received Dilaudid postop- had problems with memory, felt lie bugs were crawling on her, itching  . Hypothyroidism     no meds currently  . Anxiety    Past Surgical History  Procedure Date  . Cesarean section 2010  . Tonsillectomy    Family History: family history is not on file. Social History:  reports that she has never smoked. She does not have any smokeless tobacco history on file. She reports that she does not drink alcohol or use illicit drugs.  Review of Systems  Respiratory: Negative.   Cardiovascular: Negative.       Last menstrual period 12/09/2010. Maternal Exam:  Abdomen: Patient reports no abdominal tenderness. Surgical scars: low transverse.   Estimated fetal weight is 7 1/2 lbs.   Fetal presentation: vertex  Introitus: Normal vulva. Normal vagina.    Physical Exam  Constitutional: She appears well-developed and well-nourished.  Neck: Neck supple. No thyromegaly present.  Cardiovascular: Normal rate, regular rhythm and normal heart sounds.   No murmur heard. Respiratory: Effort normal. No respiratory distress. She has no wheezes.  GI: Soft.       Gravid     Prenatal labs: ABO, Rh: O/Positive/-- (07/02 0000) Antibody: Negative (07/02 0000) Rubella: Immune (07/02 0000) RPR: NON REACTIVE (12/28 1006)  HBsAg: Negative (07/02 0000)  HIV: Non-reactive (07/02 0000)  GBS:   pos   Assessment/Plan: IUP at 39 weeks, previous c-section, declines VBAC.   Discussed repeat c-section procedure, risks and chances of achieving goals.  Will admit for repeat c-section.     [name] 09/06/2011, 8:18 AM

## 2011-09-06 NOTE — Anesthesia Postprocedure Evaluation (Signed)
Anesthesia Post Note  Patient: [name]  Procedure(s) Performed:  CESAREAN SECTION  Anesthesia type: Spinal  Patient location: PACU  Post pain: Pain level controlled  Post assessment: Post-op Vital signs reviewed  Last Vitals:  Filed Vitals:   09/06/11 1440  BP: 113/71  Pulse: 51  Temp: 36.3 C  Resp: 18    Post vital signs: Reviewed  Level of consciousness: awake  Complications: No apparent anesthesia complications

## 2011-09-06 NOTE — Transfer of Care (Signed)
Immediate Anesthesia Transfer of Care Note  Patient: [name]  Procedure(s) Performed:  CESAREAN SECTION  Patient Location: PACU  Anesthesia Type: Spinal  Level of Consciousness: awake, alert  and oriented  Airway & Oxygen Therapy: Patient Spontanous Breathing  Post-op Assessment: Report given to PACU RN and Post -op Vital signs reviewed and stable  Post vital signs: Reviewed and stable  Complications: No apparent anesthesia complications

## 2011-09-06 NOTE — Progress Notes (Signed)
Patient complain about having pain that feels like a cramping pain. Patient recently received Tordal 30 MG IV push around 1330. Patient stated that "she did not want the pain to be uncontrolled and become unbearable. Dr. Jackelyn Knife was notify about patient status and stated it was okay to give the patient Oxycodone that was previously prescribed. Rn will monitor for now.

## 2011-09-06 NOTE — Op Note (Signed)
Preoperative diagnosis: Intrauterine pregnancy at 39 weeks, previous cesarean section Postoperative diagnosis: Same Procedure: Repeat low transverse cesarean section without extensions Surgeon: Lavina Hamman M.D. Assistant:  Tracey Harries, MD Anesthesia: Epidural Findings: Patient had normal gravid anatomy and delivered a viable female infant with Apgars of 9 and 9 weighing 8 lbs. 2 oz. Estimated blood loss: 800 cc Specimens: Placenta sent to labor and delivery Complications: None  Procedure in detail: The patient was taken to the operating room and placed in the sitting position. Dr. Brayton Caves instilled spinal anesthesia. Abdomen was then prepped and draped in the usual sterile fashion. A Foley catheter was placed. The level of her anesthesia was found to be adequate. Abdomen was entered via a standard Pfannenstiel incision through her previous scar. Once the peritoneal cavity was entered the Alexis disposable self-retaining retractor was placed and good visualization was achieved. A 4 cm transverse incision was then made in the lower uterine segment pushing the bladder inferior. Once the uterine cavity was entered the incision was extended digitally. The fetal vertex was grasped and delivered through the incision atraumatically. Mouth and nares were suctioned. The remainder of the infant then delivered atraumatically. Cord was doubly clamped and cut and the infant handed to the awaiting pediatric team. Cord blood was obtained. The placenta delivered spontaneously. Uterus was wiped dry with clean lap pad and all clots and debris were removed. Uterine incision was inspected and found to be free of extensions. Uterine incision was closed in 2 layers with the first layer being a running locking layer with 0 Vicryl starting at both ends and meeting in the middle, and the second layer being an imbricating layer with #1 chromic. Tubes and ovaries were inspected and found to be normal. Uterine incision  was inspected and found to be hemostatic. Bleeding from serosal edges was controlled with electrocautery. The Alexis retractor was removed. Subfascial space was irrigated and made hemostatic with electrocautery. Fascia was closed in running fashion starting at both ends and meeting in the middle with 0 Vicryl. Subcutaneous tissue was then irrigated and made hemostatic with electrocautery and closed with running 2-0 plain gut. Skin was closed with staples followed by a sterile dressing. Patient tolerated the procedure well and was taken to the recovery in stable condition. Counts were correct x2, she received Ancef 2 g IV at the beginning of the procedure and she had PAS hose on throughout the procedure.

## 2011-09-06 NOTE — Anesthesia Preprocedure Evaluation (Addendum)
Anesthesia Evaluation  Patient identified by MRN, date of birth, ID band Patient awake    Reviewed: Allergy & Precautions, H&P , Patient's Chart, lab work & pertinent test results  Airway Mallampati: II TM Distance: >3 FB Neck ROM: full    Dental No notable dental hx.    Pulmonary neg pulmonary ROS,  clear to auscultation  Pulmonary exam normal       Cardiovascular neg cardio ROS regular Normal    Neuro/Psych Negative Neurological ROS  Negative Psych ROS   GI/Hepatic negative GI ROS, Neg liver ROS,   Endo/Other  Negative Endocrine ROSHypothyroidism   Renal/GU negative Renal ROS     Musculoskeletal   Abdominal   Peds  Hematology negative hematology ROS (+)   Anesthesia Other Findings Feeling of bugs crawling after dilaudid  Reproductive/Obstetrics (+) Pregnancy                          Anesthesia Physical Anesthesia Plan  ASA: II  Anesthesia Plan: Epidural   Post-op Pain Management:    Induction:   Airway Management Planned:   Additional Equipment:   Intra-op Plan:   Post-operative Plan:   Informed Consent: I have reviewed the patients History and Physical, chart, labs and discussed the procedure including the risks, benefits and alternatives for the proposed anesthesia with the patient or authorized representative who has indicated his/her understanding and acceptance.     Plan Discussed with:   Anesthesia Plan Comments:         Anesthesia Quick Evaluation

## 2011-09-06 NOTE — Progress Notes (Signed)
Date of Initial H&P: 08/2012  History reviewed, patient examined, no change in status, stable for surgery. 

## 2011-09-06 NOTE — Anesthesia Procedure Notes (Signed)
Spinal  Patient location during procedure: OR Start time: 09/06/2011 12:26 PM Staffing Anesthesiologist: Brayton Caves R Performed by: anesthesiologist  Preanesthetic Checklist Completed: patient identified, site marked, surgical consent, pre-op evaluation, timeout performed, IV checked, risks and benefits discussed and monitors and equipment checked Spinal Block Patient position: sitting Prep: DuraPrep Patient monitoring: heart rate, cardiac monitor, continuous pulse ox and blood pressure Approach: midline Location: L3-4 Injection technique: single-shot Needle Needle type: Sprotte  Needle gauge: 24 G Needle length: 9 cm Assessment Sensory level: T4 Additional Notes Patient identified.  Risk benefits discussed including failed block, incomplete pain control, headache, nerve damage, paralysis, blood pressure changes, nausea, vomiting, reactions to medication both toxic or allergic, and postpartum back pain.  Patient expressed understanding and wished to proceed.  All questions were answered.  Sterile technique used throughout procedure.  CSF was clear. Transient L buttock parasthesia as needle passed through dura, resolved without redirection of needle. No complications.  Please see nursing notes for vital signs.

## 2011-09-07 ENCOUNTER — Encounter (HOSPITAL_COMMUNITY): Payer: Self-pay | Admitting: Obstetrics and Gynecology

## 2011-09-07 LAB — CBC
HCT: 29.7 % — ABNORMAL LOW (ref 36.0–46.0)
MCH: 30 pg (ref 26.0–34.0)
MCHC: 33.3 g/dL (ref 30.0–36.0)
MCV: 90 fL (ref 78.0–100.0)
Platelets: 128 10*3/uL — ABNORMAL LOW (ref 150–400)
RDW: 14.3 % (ref 11.5–15.5)
WBC: 10.7 10*3/uL — ABNORMAL HIGH (ref 4.0–10.5)

## 2011-09-07 NOTE — Addendum Note (Signed)
Addendum  created 09/07/11 0747 by Madison Hickman   Modules edited:Notes Section

## 2011-09-07 NOTE — Anesthesia Postprocedure Evaluation (Signed)
  Anesthesia Post-op Note  Patient: [name]  Procedure(s) Performed:  CESAREAN SECTION  Patient Location: Mother/Baby  Anesthesia Type: Spinal  Level of Consciousness: awake, alert  and oriented  Airway and Oxygen Therapy: Patient Spontanous Breathing  Post-op Pain: none  Post-op Assessment: Post-op Vital signs reviewed, Patient's Cardiovascular Status Stable, No headache, No backache, No residual numbness and No residual motor weakness  Post-op Vital Signs: Reviewed and stable  Complications: No apparent anesthesia complications

## 2011-09-07 NOTE — Progress Notes (Signed)
Subjective: Postpartum Day #1: Cesarean Delivery Patient reports incisional pain and tolerating PO.    Objective: Vital signs in last 24 hours: Temp:  [97.1 F (36.2 C)-98.2 F (36.8 C)] 98.2 F (36.8 C) (01/03 0545) Pulse Rate:  [51-86] 57  (01/03 0545) Resp:  [16-20] 20  (01/03 0545) BP: (92-125)/(46-77) 97/63 mmHg (01/03 0545) SpO2:  [96 %-98 %] 97 % (01/03 0200) Weight:  [91.627 kg (202 lb)] 202 lb (91.627 kg) (01/02 1440)  Physical Exam:  General: alert Lochia: appropriate Uterine Fundus: firm Incision: dressing C/D/I   Basename 09/07/11 0510  HGB 9.9*  HCT 29.7*    Assessment/Plan: Status post Cesarean section. Doing well postoperatively.  Continue current care, ambulate, discussed circumcision procedure and risks.  Rossetta Kama D 09/07/2011, 8:11 AM

## 2011-09-08 NOTE — Progress Notes (Signed)
POD #2 Doing well Afeb, VSS Abd- soft, fundus firm, incision intact Continue routine care 

## 2011-09-08 NOTE — Progress Notes (Signed)
CLINICAL SOCIAL WORK  BRIEF PSYCHOSOCIAL ASSESSMENT  Referred by: CN    On: 09/07/10   For: Hx. Anxiety     Patient Interview_X_ Family Interview_X_  Other:   PSYCHOSOCIAL DATA:   Lives Alone  Lives with: husband and 2.40 year old son  Primary Support (Name/Relationship): Chris Talley/husband Degree of support available: Great support system of family and friends.  CURRENT CONCERNS:     None noted Substance Abuse     _X_Behavioral Health Issues-Hx of Anxiety    Financial Resources     Abuse/Neglect/Domestic Violence   Cultural/Religious Issues     Post-Acute Placement    Adjustment to Illness     Knowledge/Cognitive Deficit      Other:     SOCIAL WORK ASSESSMENT/PLAN:  SW met with parents in MOB's first floor room to complete assessment and inquire about MOB's emotional state.  SW discussed signs and symptoms of PPD and ensured that MOB feels comfortable talking with her doctor if symptoms arise.  MOB has had numerous family and friends visiting her and FOB is involved and supportive.  MOB took medication for Anxiety in the past but denies symptoms or need for medication at this time.  SW has no concerns about MOB's current emotional state.  _X_No Further Intervention Required  Psychosocial Support/Ongoing Assessment of Needs Information/Referral to Community Resources Other  PATIENT'S/FAMILY'S RESPONSE TO PLAN OF CARE:  Parents were very friendly and seemed appreciative of SW's visit.  They state no questions, needs or concerns at this time.  MOB will talk with her doctor if symptoms of Anxiety or Depression arise. 

## 2011-09-09 DIAGNOSIS — Z98891 History of uterine scar from previous surgery: Secondary | ICD-10-CM

## 2011-09-09 MED ORDER — IBUPROFEN 600 MG PO TABS: 600.0000 mg | ORAL_TABLET | Freq: Four times a day (QID) | ORAL | Status: AC | PRN

## 2011-09-09 MED ORDER — OXYCODONE-ACETAMINOPHEN 5-325 MG PO TABS: 1.0000 | ORAL_TABLET | ORAL | Status: AC | PRN

## 2011-09-09 NOTE — Progress Notes (Signed)
POD #3 Doing well, no problems Afeb, VSS Abd- soft, fundus firm, incision intact D/c home 

## 2011-09-09 NOTE — Discharge Summary (Signed)
Obstetric Discharge Summary Reason for Admission: cesarean section Prenatal Procedures: amniocentesis Intrapartum Procedures: cesarean: low cervical, transverse Postpartum Procedures: none Complications-Operative and Postpartum: none Hemoglobin  Date Value Range Status  09/07/2011 9.9* 12.0-15.0 (g/dL) Final     HCT  Date Value Range Status  09/07/2011 29.7* 36.0-46.0 (%) Final    Discharge Diagnoses: Term Pregnancy-delivered  Discharge Information: Date: 09/09/2011 Activity: pelvic rest and no strenuous activity Diet: routine Medications: Ibuprofen and Percocet Condition: stable Instructions: refer to practice specific booklet Discharge to: home Follow-up Information    Follow up with Amica Harron D, MD. Make an appointment in 2 weeks. (for incision check)    Contact information:   [address] [place] [address] [phone]          Newborn Data: Live born female  Birth Weight: 8 lb 2.5 oz (3700 g) APGAR: 9,   Home with mother.  Rickie Gutierres D 09/09/2011, 8:37 AM

## 2013-11-07 ENCOUNTER — Other Ambulatory Visit: Payer: Self-pay | Admitting: Obstetrics and Gynecology

## 2013-11-07 DIAGNOSIS — R928 Other abnormal and inconclusive findings on diagnostic imaging of breast: Secondary | ICD-10-CM

## 2013-11-17 ENCOUNTER — Ambulatory Visit
Admission: RE | Admit: 2013-11-17 | Discharge: 2013-11-17 | Disposition: A | Discharge status: Home / Self Care | Payer: BC Managed Care – PPO | Source: Ambulatory Visit | Attending: Obstetrics and Gynecology | Admitting: Obstetrics and Gynecology

## 2013-11-17 DIAGNOSIS — R928 Other abnormal and inconclusive findings on diagnostic imaging of breast: Secondary | ICD-10-CM

## 2013-11-18 ENCOUNTER — Other Ambulatory Visit: Payer: BC Managed Care – PPO

## 2013-11-21 ENCOUNTER — Other Ambulatory Visit: Payer: BC Managed Care – PPO

## 2014-07-06 ENCOUNTER — Encounter (HOSPITAL_COMMUNITY): Payer: Self-pay | Admitting: Obstetrics and Gynecology
